# Patient Record
Sex: Male | Born: 1980 | Race: Black or African American | Hispanic: No | Smoking: Never smoker
Health system: Southern US, Community
[De-identification: ages and names within clinical notes are randomized; demographics above are authoritative.]

## PROBLEM LIST (undated history)

## (undated) HISTORY — PX: ABDOMINAL SURGERY: SHX537

---

## 2011-08-07 ENCOUNTER — Ambulatory Visit: Payer: Self-pay | Admitting: Physician Assistant

## 2011-08-07 ENCOUNTER — Other Ambulatory Visit: Payer: Self-pay | Admitting: Internal Medicine

## 2011-08-07 VITALS — BP 123/79 | HR 73 | Temp 98.1°F | Resp 16 | Ht 69.5 in | Wt 266.0 lb

## 2011-08-07 DIAGNOSIS — Z111 Encounter for screening for respiratory tuberculosis: Secondary | ICD-10-CM

## 2011-08-07 DIAGNOSIS — IMO0001 Reserved for inherently not codable concepts without codable children: Secondary | ICD-10-CM

## 2011-08-07 NOTE — Progress Notes (Signed)
Travis Francis  requests immunization review for new job.  Needs PPD, MMR titer, varicella titer and is declining Hep B titer for financial reasons.  Does not have any records of immunizations. Tdap done last 5/12 here.  Recommend titers as above, apply PPD. Copy of form with application of PPD made today and is in paper chart. He will return in 48-72 hours with original to fill in titer information and PPD results.

## 2011-08-08 ENCOUNTER — Encounter: Payer: Self-pay | Admitting: Family Medicine

## 2011-08-08 LAB — MEASLES/MUMPS/RUBELLA IMMUNITY
Rubella: 8.1 IU/mL — ABNORMAL HIGH
Rubeola IgG: 4.39 {ISR} — ABNORMAL HIGH

## 2011-08-08 LAB — HEPATITIS B SURFACE ANTIBODY, QUANTITATIVE: Hepatitis B-Post: 0 m[IU]/mL

## 2011-08-09 ENCOUNTER — Ambulatory Visit (INDEPENDENT_AMBULATORY_CARE_PROVIDER_SITE_OTHER): Payer: Self-pay | Admitting: Physician Assistant

## 2011-08-09 DIAGNOSIS — Z043 Encounter for examination and observation following other accident: Secondary | ICD-10-CM

## 2011-08-09 DIAGNOSIS — IMO0001 Reserved for inherently not codable concepts without codable children: Secondary | ICD-10-CM

## 2011-08-09 NOTE — Progress Notes (Signed)
   Patient ID: Travis Francis MRN: [id number], DOB: 20-Nov-1980, 31 y.o. Date of Encounter: 08/09/2011, 5:55 PM  Primary Physician: Dois Davenport., MD, MD  Chief Complaint: lab review  HPI: 31 y.o. year old male with history below presents for lab review. Had MMR, varicella, and hepatitis B titers drawn on 08/06/21 as part of a new job for [employer]. He also had a PPD placed on 2/25 which was negative.   Labs indicate immune to MMR and varicella. Not immune to Hepatitis B. Has never received hepatitis B vaccine. Has signed hepatitis declination form and understands this.  No office visit took place today. Labs reviewed only.   Labs: Results for orders placed in visit on 08/07/11  MEASLES/MUMPS/RUBELLA IMMUNITY      Component Value Range   Rubella 8.1 (*)    Mumps IgG 1.97 (*)    Rubeola IgG 4.39 (*)   HEPATITIS B SURFACE ANTIBODY, QUANTITATIVE      Component Value Range   Hepatitis B-Post 0.0    VARICELLA ZOSTER ANTIBODY, IGG      Component Value Range   Varicella IgG 4.90 (*)      ASSESSMENT AND PLAN:  31 y.o. year old male here to review labs. -Labs reviewed -Patient to RTC 7-10 days for second step of PPD.  SignedEula Listen, PA-C 08/09/2011 5:55 PM

## 2011-08-16 ENCOUNTER — Ambulatory Visit: Payer: Self-pay | Admitting: Physician Assistant

## 2011-08-16 DIAGNOSIS — Z111 Encounter for screening for respiratory tuberculosis: Secondary | ICD-10-CM

## 2013-04-13 ENCOUNTER — Encounter (HOSPITAL_COMMUNITY): Payer: Self-pay | Admitting: Emergency Medicine

## 2013-04-13 ENCOUNTER — Emergency Department (HOSPITAL_COMMUNITY): Payer: Self-pay

## 2013-04-13 ENCOUNTER — Emergency Department (HOSPITAL_COMMUNITY)
Admission: EM | Admit: 2013-04-13 | Discharge: 2013-04-13 | Disposition: A | Discharge status: Home / Self Care | Payer: Self-pay | Attending: Emergency Medicine | Admitting: Emergency Medicine

## 2013-04-13 DIAGNOSIS — F17200 Nicotine dependence, unspecified, uncomplicated: Secondary | ICD-10-CM | POA: Insufficient documentation

## 2013-04-13 DIAGNOSIS — M25569 Pain in unspecified knee: Secondary | ICD-10-CM | POA: Insufficient documentation

## 2013-04-13 DIAGNOSIS — R609 Edema, unspecified: Secondary | ICD-10-CM | POA: Insufficient documentation

## 2013-04-13 DIAGNOSIS — G8929 Other chronic pain: Secondary | ICD-10-CM | POA: Insufficient documentation

## 2013-04-13 MED ORDER — HYDROCODONE-ACETAMINOPHEN 5-325 MG PO TABS: 1.0000 | ORAL_TABLET | ORAL | Status: DC | PRN

## 2013-04-13 MED ORDER — IBUPROFEN 800 MG PO TABS: 800.0000 mg | ORAL_TABLET | Freq: Three times a day (TID) | ORAL | Status: DC

## 2013-04-13 NOTE — ED Provider Notes (Signed)
CSN: [id number]     Arrival date & time 04/13/13  0120 History   First MD Initiated Contact with Patient 04/13/13 0130     Chief Complaint  Patient presents with  . Knee Pain   (Consider location/radiation/quality/duration/timing/severity/associated sxs/prior Treatment) Patient is a 32 y.o. male presenting with knee pain. The history is provided by the patient.  Knee Pain Location:  Knee Injury: no   Knee location:  R knee Pain details:    Quality:  Aching   Radiates to:  Does not radiate   Severity:  Mild   Onset quality:  Gradual   Timing:  Intermittent   Progression:  Unchanged Chronicity:  Chronic Dislocation: no   Foreign body present:  No foreign bodies Relieved by:  Nothing Worsened by:  Nothing tried Associated symptoms: no fever     History reviewed. No pertinent past medical history. Past Surgical History  Procedure Laterality Date  . Abdominal surgery     No family history on file. History  Substance Use Topics  . Smoking status: Current Some Day Smoker  . Smokeless tobacco: Not on file  . Alcohol Use: Yes     Comment: Social    Review of Systems  Constitutional: Negative for fever.  Respiratory: Negative for cough and shortness of breath.   Gastrointestinal: Negative for vomiting and abdominal pain.  All other systems reviewed and are negative.    Allergies  Review of patient's allergies indicates no known allergies.  Home Medications   Current Outpatient Rx  Name  Route  Sig  Dispense  Refill  . ibuprofen (ADVIL,MOTRIN) 200 MG tablet   Oral   Take 800 mg by mouth every 6 (six) hours as needed for pain.         . Multiple Vitamin (MULTIVITAMIN WITH MINERALS) TABS tablet   Oral   Take 1 tablet by mouth daily.         Marland Kitchen HYDROcodone-acetaminophen (NORCO/VICODIN) 5-325 MG per tablet   Oral   Take 1 tablet by mouth every 4 (four) hours as needed for pain.   10 tablet   0   . ibuprofen (ADVIL,MOTRIN) 800 MG tablet   Oral   Take 1  tablet (800 mg total) by mouth 3 (three) times daily.   21 tablet   0    There were no vitals taken for this visit. Physical Exam  Nursing note and vitals reviewed. Constitutional: He is oriented to person, place, and time. He appears well-developed and well-nourished. No distress.  HENT:  Head: Normocephalic and atraumatic.  Mouth/Throat: No oropharyngeal exudate.  Eyes: EOM are normal. Pupils are equal, round, and reactive to light.  Neck: Normal range of motion. Neck supple.  Cardiovascular: Normal rate and regular rhythm.  Exam reveals no friction rub.   No murmur heard. Pulmonary/Chest: Effort normal and breath sounds normal. No respiratory distress. He has no wheezes. He has no rales.  Abdominal: He exhibits no distension. There is no tenderness. There is no rebound.  Musculoskeletal: Normal range of motion.       Right lower leg: He exhibits bony tenderness (superior tibia) and edema (superior tibia). He exhibits no tenderness, no swelling, no deformity and no laceration.  Neurological: He is alert and oriented to person, place, and time.  Skin: He is not diaphoretic.    ED Course  Procedures (including critical care time) Labs Review Labs Reviewed - No data to display Imaging Review Dg Knee Complete 4 Views Right  04/13/2013   CLINICAL  DATA:  Anterior proximal tibial pain  EXAM: RIGHT KNEE - COMPLETE 4+ VIEW  COMPARISON:  None.  FINDINGS: There is soft tissue swelling anterior to the tibial tuberosity, which is fragmented the -likely on a chronic basis given corticated margins. No fracture identified. No joint effusion. No malalignment.  IMPRESSION: Nonspecific soft tissue swelling overlies the chronically fragmented tibial tuberosity.   Electronically Signed   By: Tiburcio Pea M.D.   On: 04/13/2013 02:04    EKG Interpretation   None       MDM   1. Knee pain, chronic, right    11M with chronic R knee pain. Intermittent. Patient denies trauma, fever, N/V, warmth  to leg. Here R tibial tuberosity mildly swollen. No erythema. No fluctuance. NVI distally. Xray shows chronically fragmented tibia. Patient can bear weight, but with pain. Given toradol here, given small amount of vicodin to go home.     Dagmar Hait, MD 04/13/13 [phone]

## 2013-04-13 NOTE — ED Notes (Signed)
See downtime documentation 

## 2013-04-13 NOTE — ED Notes (Signed)
Pt presented w/ c/o right knee pain and swelling x 1 week.  Pt states pain is similar to when he had an infection in the joint. toradol 60mg  IM given during downtime see paper chart.

## 2014-11-13 ENCOUNTER — Ambulatory Visit (HOSPITAL_BASED_OUTPATIENT_CLINIC_OR_DEPARTMENT_OTHER): Payer: PRIVATE HEALTH INSURANCE | Attending: Family Medicine | Admitting: *Deleted

## 2014-11-13 VITALS — Ht 70.0 in | Wt 266.0 lb

## 2014-11-13 DIAGNOSIS — G4733 Obstructive sleep apnea (adult) (pediatric): Secondary | ICD-10-CM | POA: Diagnosis not present

## 2014-11-13 DIAGNOSIS — R0683 Snoring: Secondary | ICD-10-CM

## 2014-11-13 DIAGNOSIS — G4710 Hypersomnia, unspecified: Secondary | ICD-10-CM | POA: Diagnosis present

## 2014-11-13 DIAGNOSIS — E669 Obesity, unspecified: Secondary | ICD-10-CM

## 2014-11-14 DIAGNOSIS — R0683 Snoring: Secondary | ICD-10-CM | POA: Diagnosis not present

## 2014-11-14 DIAGNOSIS — E669 Obesity, unspecified: Secondary | ICD-10-CM | POA: Diagnosis not present

## 2014-11-14 NOTE — Sleep Study (Signed)
   NAME: Travis Francis DATE OF BIRTH:  04/08/1981 MEDICAL RECORD NUMBER [id number]  LOCATION: [facility]  PHYSICIAN: YOUNG,CLINTON D  DATE OF STUDY: 11/13/2014  SLEEP STUDY TYPE: Nocturnal Polysomnogram               REFERRING PHYSICIAN: Renaye RakersBland, Veita, MD  INDICATION FOR STUDY: Hypersomnia with sleep apnea  EPWORTH SLEEPINESS SCORE:   10/24 HEIGHT: 5\' 10"  (177.8 cm)  WEIGHT: 266 lb (120.657 kg)    Body mass index is 38.17 kg/(m^2).  NECK SIZE: 18.25 in.  MEDICATIONS: Charted for review  SLEEP ARCHITECTURE: Split study protocol. During the diagnostic phase, total sleep time 152 minutes with sleep efficiency 79.8%. Stage I was 6.3%, stage II 83.2%, stage III absent, REM 10.5% of total sleep time. Sleep latency 23 minutes, REM latency 128.5 minutes, awake after sleep onset 10.5 minutes, arousal index 24.1, and time medication: None  RESPIRATORY DATA: Apnea hypopnea index (AHI) 40.3 per hour. 102 total events scored including 42 obstructive apneas and 60 hypopneas. Events were more common while supine. REM AHI 67.5 per hour. CPAP was titrated to 8 CWP, AHI 0 per hour. He wore a medium nasal mask.  OXYGEN DATA: Moderate to loud snoring before CPAP with oxygen desaturation to a nadir of 77% on room air. With CPAP control, snoring was prevented and mean oxygen saturation was 97.1%.  CARDIAC DATA: Sinus rhythm  MOVEMENT/PARASOMNIA: No significant movement disturbance, no bathroom trips  IMPRESSION/ RECOMMENDATION:   1) Severe obstructive sleep apnea/hypopnea syndrome, AHI 40.3 per hour with events in all positions, more frequently while supine. Moderate to loud snoring with oxygen desaturation to a nadir of 77% on room air. 2) Successful CPAP titration to 8 CWP, AHI 0 per hour. He wore a medium F&P Eson nasal mask with heated humidifier. Snoring was prevented and mean oxygen saturation was 97.1% with CPAP.   Waymon BudgeYOUNG,CLINTON D Diplomate, American Board of Sleep  Medicine  ELECTRONICALLY SIGNED ON:  11/14/2014, 10:29 AM [facility] PH: [phone]   FX: [phone] ACCREDITED BY THE AMERICAN ACADEMY OF SLEEP MEDICINE

## 2017-03-02 ENCOUNTER — Other Ambulatory Visit (HOSPITAL_BASED_OUTPATIENT_CLINIC_OR_DEPARTMENT_OTHER): Payer: Self-pay

## 2017-03-02 DIAGNOSIS — R5383 Other fatigue: Secondary | ICD-10-CM

## 2017-03-19 ENCOUNTER — Encounter: Payer: PRIVATE HEALTH INSURANCE | Attending: Family Medicine | Admitting: Registered"

## 2017-03-19 ENCOUNTER — Encounter: Payer: Self-pay | Admitting: Registered"

## 2017-03-19 DIAGNOSIS — E669 Obesity, unspecified: Secondary | ICD-10-CM | POA: Insufficient documentation

## 2017-03-19 DIAGNOSIS — R7303 Prediabetes: Secondary | ICD-10-CM | POA: Insufficient documentation

## 2017-03-19 DIAGNOSIS — Z713 Dietary counseling and surveillance: Secondary | ICD-10-CM | POA: Insufficient documentation

## 2017-03-19 NOTE — Patient Instructions (Signed)
Aim to eat balanced meals and include protein when eating carbohydrates and fruit. Keep up the walking and aim to add more resistance training or other more intense work out 2-3 times per week. Work on getting enough quality sleep.

## 2017-03-19 NOTE — Progress Notes (Signed)
Medical Nutrition Therapy:  Appt start time: 1625 end time:  1730.  Assessment:  Primary concerns today: Pt states he is here for  pre-diabetes education. Pt states he has been reading on the internet how to eat for DM and gout and has read conflicting information and wants to make sure he is eating correctly to manage both. Pt states he takes half the gout medication and seems to keep symptoms under control, will have a twinge in fingers or foot at times. Per referral A1c is 6.1% as of 12/21/2016. Pt states he has been cutting out sweets and has cut back red meat due to gout.  Pt states he feels he is not eating enough and has mostly been eating fruits and vegetables. Pt reports that he is not that hungry and will eat because he knows he should. Pt reports weight loss over the last year which he feels good about.   Pt states he often gets less than 7 hrs of sleep and he believes he has sleep apnea. Pt states he is scheduled to have a sleep study.  Preferred Learning Style:   No preference indicated   Learning Readiness:   Change in progress  MEDICATIONS: reviewed (Janumet XR [phone] mg)   DIETARY INTAKE:  Everyday foods include fruit and nuts.  Avoided foods include processed & sweet foods.    24-hr recall:  B ( AM): fruit OR nuts Snk ( AM): none OR   L ( PM): fruit OR meat, 2 veggies Snk ( PM): none OR nuts or fruit D ( PM): broccoli, green beans, chicken Snk ( PM): none OR fruit Beverages: water, was drinking juices but not for last 2 weeks, occasionally alcohol.  Usual physical activity: 5-7 days/week walks, tracks steps fitbit 10,000 steps   Estimated energy needs: 2000 calories 225 g carbohydrates 125 g protein 67 g fat  Progress Towards Goal(s):  In progress.   Nutritional Diagnosis:  NI-5.8.4 Inconsistent carbohydrate intake As related to frequent fruit intake absent of other macronutrients .  As evidenced by dietary recall.    Intervention:  Nutrition Education.  Discussed macronutrients and effects on blood glucose. Discussed role of glucose and insulin. Discussed role of exercise on BG. Discussed role of stress and sleep of BG and overall health. Described medication actions and side effects. Reviewed SMBG and A1c goals.  Plan: Aim to eat balanced meals and include protein when eating carbohydrates and fruit. Keep up the walking and aim to add more resistance training or other more intense work out 2-3 times per week. Work on getting enough quality sleep.  Teaching Method Utilized:  Visual Auditory  Handouts given during visit include:  Living Well with Diabetes (copies)  Sleep Hygiene  Snack Sheet  My Plate  Medications  A1c  Barriers to learning/adherence to lifestyle change: none  Demonstrated degree of understanding via:  Teach Back   Monitoring/Evaluation:  Dietary intake, exercise, A1c and body weight prn.

## 2017-03-20 DIAGNOSIS — R7303 Prediabetes: Secondary | ICD-10-CM | POA: Insufficient documentation

## 2017-04-04 ENCOUNTER — Encounter (HOSPITAL_BASED_OUTPATIENT_CLINIC_OR_DEPARTMENT_OTHER): Payer: Self-pay

## 2017-04-04 ENCOUNTER — Ambulatory Visit (HOSPITAL_BASED_OUTPATIENT_CLINIC_OR_DEPARTMENT_OTHER): Payer: PRIVATE HEALTH INSURANCE | Attending: Family Medicine | Admitting: Internal Medicine

## 2017-04-16 ENCOUNTER — Other Ambulatory Visit (HOSPITAL_BASED_OUTPATIENT_CLINIC_OR_DEPARTMENT_OTHER): Payer: Self-pay

## 2017-04-16 DIAGNOSIS — R5383 Other fatigue: Secondary | ICD-10-CM

## 2017-04-20 ENCOUNTER — Encounter (HOSPITAL_BASED_OUTPATIENT_CLINIC_OR_DEPARTMENT_OTHER): Payer: PRIVATE HEALTH INSURANCE

## 2017-04-23 ENCOUNTER — Ambulatory Visit (HOSPITAL_BASED_OUTPATIENT_CLINIC_OR_DEPARTMENT_OTHER): Payer: PRIVATE HEALTH INSURANCE | Attending: Family Medicine | Admitting: Internal Medicine

## 2017-04-23 DIAGNOSIS — R5383 Other fatigue: Secondary | ICD-10-CM | POA: Insufficient documentation

## 2017-04-23 DIAGNOSIS — G4733 Obstructive sleep apnea (adult) (pediatric): Secondary | ICD-10-CM | POA: Diagnosis not present

## 2017-04-27 DIAGNOSIS — R5383 Other fatigue: Secondary | ICD-10-CM | POA: Diagnosis not present

## 2017-04-27 NOTE — Procedures (Signed)
   Patient Name: Travis Francis, Jandel Study Date: 04/23/2017 Gender: Male D.O.B: [birth year] Age (years): 36 Referring Provider: Renaye RakersVeita Bland Height (inches): 70 Interpreting Physician: Jetty Duhamellinton Hokulani Rogel MD, ABSM Weight (lbs): 240 RPSGT: [place] S[place], [place] BMI: 34 MRN: [id number] Neck Size: 17.00 CLINICAL INFORMATION Sleep Study Type: HST  Indication for sleep study: Fatigue  Epworth Sleepiness Score: 6   Most recent polysomnogram was on 04/05/2017. SLEEP STUDY TECHNIQUE A multi-channel overnight portable sleep study was performed. The channels recorded were: nasal airflow, thoracic respiratory movement, and oxygen saturation with a pulse oximetry. Snoring was also monitored.  MEDICATIONS Patient self administered medications include: none reported.  SLEEP ARCHITECTURE Patient was studied for 489.6 minutes. The sleep efficiency was 100.0 % and the patient was supine for 79.2%. The arousal index was 0.0 per hour.  RESPIRATORY PARAMETERS The overall AHI was 30.6 per hour, with a central apnea index of 0.0 per hour.  The oxygen nadir was 76% during sleep.  CARDIAC DATA Mean heart rate during sleep was 73.6 bpm.  IMPRESSIONS - Severe obstructive sleep apnea occurred during this study (AHI = 30.6/h). - No significant central sleep apnea occurred during this study (CAI = 0.0/h). - Oxygen desaturation was noted during this study (Min O2 = 76%, Mean 94%). - Patient snored.  DIAGNOSIS - Obstructive Sleep Apnea (327.23 [G47.33 ICD-10])  RECOMMENDATIONS - Suggest CPAP titration or AutoPap for scores in this range. Other options would be based on clinical judgment. - Positional therapy avoiding supine position during sleep. - Avoid alcohol, sedatives and other CNS depressants that may worsen sleep apnea and disrupt normal sleep architecture. - Sleep hygiene should be reviewed to assess factors that may improve sleep quality. - Weight management and regular exercise should be initiated or  continued.  [Electronically signed] 04/27/2017 01:36 PM  Jetty Duhamellinton Jermiya Reichl MD, ABSM Diplomate, American Board of Sleep Medicine   NPI: [id number]                          Jetty Duhamellinton Safari Cinque Diplomate, American Board of Sleep Medicine  ELECTRONICALLY SIGNED ON:  04/27/2017, 1:37 PM [facility] PH: [phone]   FX: [phone] ACCREDITED BY THE AMERICAN ACADEMY OF SLEEP MEDICINE

## 2018-08-26 DIAGNOSIS — E1169 Type 2 diabetes mellitus with other specified complication: Secondary | ICD-10-CM | POA: Diagnosis not present

## 2018-08-26 DIAGNOSIS — R7309 Other abnormal glucose: Secondary | ICD-10-CM | POA: Diagnosis not present

## 2018-08-26 DIAGNOSIS — M109 Gout, unspecified: Secondary | ICD-10-CM | POA: Diagnosis not present

## 2018-12-26 DIAGNOSIS — G4730 Sleep apnea, unspecified: Secondary | ICD-10-CM | POA: Diagnosis not present

## 2018-12-26 DIAGNOSIS — R7309 Other abnormal glucose: Secondary | ICD-10-CM | POA: Diagnosis not present

## 2018-12-26 DIAGNOSIS — M109 Gout, unspecified: Secondary | ICD-10-CM | POA: Diagnosis not present

## 2018-12-26 DIAGNOSIS — E6609 Other obesity due to excess calories: Secondary | ICD-10-CM | POA: Diagnosis not present

## 2018-12-26 DIAGNOSIS — E1169 Type 2 diabetes mellitus with other specified complication: Secondary | ICD-10-CM | POA: Diagnosis not present

## 2019-03-28 DIAGNOSIS — M109 Gout, unspecified: Secondary | ICD-10-CM | POA: Diagnosis not present

## 2019-03-28 DIAGNOSIS — E1169 Type 2 diabetes mellitus with other specified complication: Secondary | ICD-10-CM | POA: Diagnosis not present

## 2019-03-28 DIAGNOSIS — E6609 Other obesity due to excess calories: Secondary | ICD-10-CM | POA: Diagnosis not present

## 2019-07-29 DIAGNOSIS — R635 Abnormal weight gain: Secondary | ICD-10-CM | POA: Diagnosis not present

## 2019-07-29 DIAGNOSIS — R7309 Other abnormal glucose: Secondary | ICD-10-CM | POA: Diagnosis not present

## 2019-07-29 DIAGNOSIS — M109 Gout, unspecified: Secondary | ICD-10-CM | POA: Diagnosis not present

## 2019-07-29 DIAGNOSIS — E1169 Type 2 diabetes mellitus with other specified complication: Secondary | ICD-10-CM | POA: Diagnosis not present

## 2019-07-29 DIAGNOSIS — G4730 Sleep apnea, unspecified: Secondary | ICD-10-CM | POA: Diagnosis not present

## 2019-09-25 DIAGNOSIS — H26131 Total traumatic cataract, right eye: Secondary | ICD-10-CM | POA: Diagnosis not present

## 2019-11-06 DIAGNOSIS — H2511 Age-related nuclear cataract, right eye: Secondary | ICD-10-CM | POA: Diagnosis not present

## 2019-11-06 DIAGNOSIS — Z01818 Encounter for other preprocedural examination: Secondary | ICD-10-CM | POA: Diagnosis not present

## 2019-11-06 DIAGNOSIS — H26131 Total traumatic cataract, right eye: Secondary | ICD-10-CM | POA: Diagnosis not present

## 2019-11-27 DIAGNOSIS — G4730 Sleep apnea, unspecified: Secondary | ICD-10-CM | POA: Diagnosis not present

## 2019-11-27 DIAGNOSIS — E1169 Type 2 diabetes mellitus with other specified complication: Secondary | ICD-10-CM | POA: Diagnosis not present

## 2019-11-27 DIAGNOSIS — R739 Hyperglycemia, unspecified: Secondary | ICD-10-CM | POA: Diagnosis not present

## 2019-11-27 DIAGNOSIS — M109 Gout, unspecified: Secondary | ICD-10-CM | POA: Diagnosis not present

## 2021-10-24 ENCOUNTER — Other Ambulatory Visit: Payer: Self-pay | Admitting: Family Medicine

## 2021-10-24 ENCOUNTER — Ambulatory Visit
Admission: RE | Admit: 2021-10-24 | Discharge: 2021-10-24 | Disposition: A | Discharge status: Home / Self Care | Payer: PRIVATE HEALTH INSURANCE | Source: Ambulatory Visit | Attending: Family Medicine | Admitting: Family Medicine

## 2021-10-24 DIAGNOSIS — R52 Pain, unspecified: Secondary | ICD-10-CM

## 2024-01-08 NOTE — Procedures (Signed)
Result scanned under media.
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
## 2024-05-26 ENCOUNTER — Ambulatory Visit: Admitting: Family Medicine

## 2024-05-26 ENCOUNTER — Encounter: Payer: Self-pay | Admitting: Family Medicine

## 2024-05-26 VITALS — BP 128/76 | HR 72 | Resp 16 | Ht 70.0 in | Wt 248.0 lb

## 2024-05-26 DIAGNOSIS — M545 Low back pain, unspecified: Secondary | ICD-10-CM

## 2024-05-26 DIAGNOSIS — M1A09X0 Idiopathic chronic gout, multiple sites, without tophus (tophi): Secondary | ICD-10-CM | POA: Diagnosis not present

## 2024-05-26 DIAGNOSIS — Z1211 Encounter for screening for malignant neoplasm of colon: Secondary | ICD-10-CM

## 2024-05-26 DIAGNOSIS — M25512 Pain in left shoulder: Secondary | ICD-10-CM

## 2024-05-26 DIAGNOSIS — M109 Gout, unspecified: Secondary | ICD-10-CM | POA: Insufficient documentation

## 2024-05-26 DIAGNOSIS — E559 Vitamin D deficiency, unspecified: Secondary | ICD-10-CM
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
## 2024-05-27 ENCOUNTER — Ambulatory Visit: Payer: Self-pay | Admitting: Family Medicine

## 2024-05-27 DIAGNOSIS — M1A09X0 Idiopathic chronic gout, multiple sites, without tophus (tophi): Secondary | ICD-10-CM

## 2024-05-27 LAB — CBC WITH DIFFERENTIAL/PLATELET
Absolute Lymphocytes: 2167 {cells}/uL (ref 850–3900)
Absolute Monocytes: 694 {cells}/uL (ref 200–950)
Basophils Absolute: 28 {cells}/uL (ref 0–200)
Basophils Relative: 0.5 %
Eosinophils Absolute: 129 {cells}/uL (ref 15–500)
Eosinophils Relative: 2.3 %
HCT: 46.1 % (ref 39.4–51.1)
Hemoglobin: 14.5 g/dL (ref 13.2–17.1)
MCH: 25.3 pg — ABNORMAL LOW (ref 27.0–33.0)
MCHC: 31.5 g/dL — ABNORMAL LOW (ref 31.6–35.4)
MCV: 80.6 fL — ABNORMAL LOW (ref 81.4–101.7)
MPV: 11.4 fL (ref 7.5–12.5)
Monocytes Relative: 12.4 %
Neutro Abs: 2582 {cells}/uL (ref 1500–7800)
Neutrophils Relative %: 46.1 %
Platelets: 353 Thousand/uL (ref 140–400)
RBC: 5.72 Million/uL (ref 4.20–5.80)
RDW: 14.5 % (ref 11.0–15.0)
Total Lymphocyte: 38.7 %
WBC: 5.6 Thousand/uL (ref 3.8–10.8)

## 2024-05-27 LAB — COMPREHENSIVE METABOLIC PANEL WITH GFR
AG Ratio: 1.8 (calc) (ref 1.0–2.5)
ALT: 58 U/L — ABNORMAL HIGH (ref 9–46)
AST: 24 U/L (ref 10–40)
Albumin: 4.6 g/dL (ref 3.6–5.1)
Alkaline phosphatase (APISO): 75 U/L (ref 36–130)
BUN: 13 mg/dL (ref 7–25)
CO2: 28 mmol/L (ref 20–32)
Calcium: 9.7 mg/dL (ref 8.6–10.3)
Chloride: 104 mmol/L (ref 98–110)
Creat: 0.9 mg/dL (ref 0.60–1.29)
Globulin: 2.6 g/dL (ref 1.9–3.7)
Glucose, Bld: 88 mg/dL (ref 65–99)
Potassium: 4.3 mmol/L (ref 3.5–5.3)
Sodium: 141 mmol/L (ref 135–146)
Total Bilirubin: 0.4 mg/dL (ref 0.2–1.2)
Total Protein: 7.2 g/dL (ref 6.1–8.1)
eGFR: 107 mL/min/1.73m2 (ref >=60)

## 2024-05-27 LAB — VITAMIN D 25 HYDROXY (VIT D DEFICIENCY, FRACTURES): Vit D, 25-Hydroxy: 53 ng/mL (ref 30–100)

## 2024-05-27 LAB — URIC ACID: Uric Acid, Serum: 7.7 mg/dL (ref 4.0–8.0)

## 2024-06-27 ENCOUNTER — Ambulatory Visit (INDEPENDENT_AMBULATORY_CARE_PROVIDER_SITE_OTHER): Admitting: Family Medicine

## 2024-06-27 ENCOUNTER — Encounter: Payer: Self-pay | Admitting: Family Medicine

## 2024-06-27 VITALS — BP 130/82 | HR 77 | Resp 16 | Ht 70.0 in | Wt 248.0 lb

## 2024-06-27 DIAGNOSIS — Z1211 Encounter for screening for malignant neoplasm of colon: Secondary | ICD-10-CM

## 2024-06-27 DIAGNOSIS — E785 Hyperlipidemia, unspecified: Secondary | ICD-10-CM

## 2024-06-27 DIAGNOSIS — M1A09X0 Idiopathic chronic gout, multiple sites, without tophus (tophi): Secondary | ICD-10-CM

## 2024-06-27 DIAGNOSIS — R7989 Other specified abnormal findings of blood chemistry: Secondary | ICD-10-CM

## 2024-06-27 DIAGNOSIS — Z113 Encounter for screening for infections with a predominantly sexual mode of transmission: Secondary | ICD-10-CM

## 2024-06-27 MED ORDER — COLCHICINE 0.6 MG PO TABS: 0.6000 mg | ORAL_TABLET | Freq: Every day | ORAL | 1 refills | Status: AC

## 2024-06-27 NOTE — Assessment & Plan Note (Addendum)
 Gout controlled, no changes to medication regimen. Refill Colchicine  0.6mg  daily.  Repeat uric acid level, lab ordered. Orders:   colchicine  0.6 MG tablet; Take 1 tablet (0.6 mg total) by mouth daily.

## 2024-06-27 NOTE — Progress Notes (Signed)
 "  Established Patient Office Visit  Subjective   Patient ID: Travis Francis, male    DOB: [birth year]  Age: 44 y.o. MRN: [id number]  Chief Complaint  Patient presents with   Consult    Discuss medication for gout    HPI Patient is a pleasant 44 year old male seen in office for one month follow up. Plan to recheck uric acid, previously within normal range but above ideal target/ therapeutic range. He voices gout has been well controlled, denies symptoms. Discussed potential to increase Colchicine  to BID vs starting Allopurinol 100mg  but since he is asymptomatic we will not make changes today. He is agreeable.   F/u on low back pain and right shoulder pain. He voices right shoulder pain resolved. He was seen by ortho for his back pain and states he was basically told he was just getting old. He voices they mentioned a PT referral but not needed at this time.   F/u on CRC screening which he is still declining colonoscopy. He is agreeable to cologuard.    ROS Negative except as per HPI    Objective:     BP 130/82   Pulse 77   Resp 16   Ht 5' 10 (1.778 m)   Wt 248 lb (112.5 kg)   SpO2 99%   BMI 35.58 kg/m  BP Readings from Last 3 Encounters:  06/27/24 130/82  05/26/24 128/76  08/07/11 123/79   Wt Readings from Last 3 Encounters:  06/27/24 248 lb (112.5 kg)  05/26/24 248 lb (112.5 kg)  04/23/17 240 lb (108.9 kg)      Physical Exam Constitutional:      Appearance: Normal appearance.  Cardiovascular:     Rate and Rhythm: Normal rate and regular rhythm.  Pulmonary:     Effort: Pulmonary effort is normal.     Breath sounds: Normal breath sounds.  Skin:    General: Skin is warm and dry.  Neurological:     General: No focal deficit present.     Mental Status: He is alert. Mental status is at baseline.  Psychiatric:        Mood and Affect: Mood normal.        Behavior: Behavior normal.      Last metabolic panel Lab Results  Component Value Date   GLUCOSE  88 05/26/2024   NA 141 05/26/2024   K 4.3 05/26/2024   CL 104 05/26/2024   CO2 28 05/26/2024   BUN 13 05/26/2024   CREATININE 0.90 05/26/2024   EGFR 107 05/26/2024   CALCIUM 9.7 05/26/2024   PROT 7.2 05/26/2024   BILITOT 0.4 05/26/2024   AST 24 05/26/2024   ALT 58 (H) 05/26/2024          Assessment & Plan:   Assessment & Plan Idiopathic chronic gout of multiple sites without tophus Gout controlled, no changes to medication regimen. Refill Colchicine  0.6mg  daily.  Repeat uric acid level, lab ordered. Orders:   colchicine  0.6 MG tablet; Take 1 tablet (0.6 mg total) by mouth daily.  Colon cancer screening CRC screening not UTD. He declines colonoscopy but is agreeable to cologuard. Cologuard ordered.  We did discuss that if cologuard is abnormal the next step would be colonoscopy. He is receptive.  Orders:   Cologuard  Dyslipidemia Lipid screening today. Patient reports prior diagnosis of high cholesterol and previous PCP did suggest starting medication, however he chose not to do so.  -Update lipid panel. Patient is fasting. -Encouraged lifestyle changes including  dietary changes, regular exercise and weight loss -Maintain scheduled follow up for annual physical  Orders:   Lipid Profile  Elevated LFTs Mild elevation in ALT. Hx of dyslipidemia as noted above.   -Encouraged dietary changes avoiding increased intake of fried or processed foods.     Screening examination for STD (sexually transmitted disease)  Orders:   HIV antibody (with reflex)   Hepatitis C Antibody      Return for as scheduled for annual physical.    Travis LOISE CORE, FNP "

## 2024-06-28 LAB — URIC ACID: Uric Acid, Serum: 9.8 mg/dL — ABNORMAL HIGH (ref 4.0–8.0)

## 2024-06-29 LAB — LIPID PANEL
Cholesterol: 166 mg/dL
HDL: 52 mg/dL
LDL Cholesterol (Calc): 91 mg/dL
Non-HDL Cholesterol (Calc): 114 mg/dL
Total CHOL/HDL Ratio: 3.2 (calc)
Triglycerides: 131 mg/dL

## 2024-06-29 LAB — HIV ANTIBODY (ROUTINE TESTING W REFLEX)
HIV 1&2 Ab, 4th Generation: NONREACTIVE
HIV FINAL INTERPRETATION: NEGATIVE

## 2024-06-29 LAB — HEPATITIS C ANTIBODY: Hepatitis C Ab: NONREACTIVE

## 2024-06-30 ENCOUNTER — Ambulatory Visit: Payer: Self-pay | Admitting: Family Medicine

## 2024-07-13 LAB — COLOGUARD: COLOGUARD: NEGATIVE

## 2024-08-25 ENCOUNTER — Encounter: Admitting: Family Medicine
